# Patient Record
Sex: Female | Born: 2000
Health system: Southern US, Community
[De-identification: ages and names within clinical notes are randomized; demographics above are authoritative.]

## PROBLEM LIST (undated history)

## (undated) DIAGNOSIS — F419 Anxiety disorder, unspecified: Secondary | ICD-10-CM

## (undated) HISTORY — DX: Anxiety disorder, unspecified: F41.9

## (undated) HISTORY — PX: OTHER SURGICAL HISTORY: SHX169

---
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
## 2022-01-12 DIAGNOSIS — F411 Generalized anxiety disorder: Secondary | ICD-10-CM | POA: Diagnosis not present

## 2022-01-27 DIAGNOSIS — F411 Generalized anxiety disorder: Secondary | ICD-10-CM | POA: Diagnosis not present

## 2022-02-12 DIAGNOSIS — F411 Generalized anxiety disorder: Secondary | ICD-10-CM | POA: Diagnosis not present

## 2022-02-25 DIAGNOSIS — F411 Generalized anxiety disorder: Secondary | ICD-10-CM | POA: Diagnosis not present

## 2022-03-09 ENCOUNTER — Other Ambulatory Visit (HOSPITAL_COMMUNITY): Payer: Self-pay

## 2022-03-09 DIAGNOSIS — F419 Anxiety disorder, unspecified: Secondary | ICD-10-CM | POA: Diagnosis not present

## 2022-03-09 MED ORDER — SERTRALINE HCL 50 MG PO TABS
75.0000 mg | ORAL_TABLET | Freq: Every day | ORAL | 0 refills | Status: DC
  Filled 2022-03-09 (×2): qty 135, 90d supply, fill #0

## 2022-03-11 DIAGNOSIS — F411 Generalized anxiety disorder: Secondary | ICD-10-CM | POA: Diagnosis not present

## 2022-03-15 DIAGNOSIS — R071 Chest pain on breathing: Secondary | ICD-10-CM | POA: Diagnosis not present

## 2022-03-15 DIAGNOSIS — F419 Anxiety disorder, unspecified: Secondary | ICD-10-CM | POA: Diagnosis not present

## 2022-03-23 DIAGNOSIS — R599 Enlarged lymph nodes, unspecified: Secondary | ICD-10-CM | POA: Diagnosis not present

## 2022-03-25 DIAGNOSIS — F411 Generalized anxiety disorder: Secondary | ICD-10-CM | POA: Diagnosis not present

## 2022-03-29 DIAGNOSIS — R599 Enlarged lymph nodes, unspecified: Secondary | ICD-10-CM | POA: Diagnosis not present

## 2022-03-29 DIAGNOSIS — J029 Acute pharyngitis, unspecified: Secondary | ICD-10-CM | POA: Diagnosis not present

## 2022-03-29 DIAGNOSIS — Z6822 Body mass index (BMI) 22.0-22.9, adult: Secondary | ICD-10-CM | POA: Diagnosis not present

## 2022-03-29 DIAGNOSIS — B2790 Infectious mononucleosis, unspecified without complication: Secondary | ICD-10-CM | POA: Diagnosis not present

## 2022-03-29 DIAGNOSIS — Z03818 Encounter for observation for suspected exposure to other biological agents ruled out: Secondary | ICD-10-CM | POA: Diagnosis not present

## 2022-03-30 DIAGNOSIS — D225 Melanocytic nevi of trunk: Secondary | ICD-10-CM | POA: Diagnosis not present

## 2022-03-30 DIAGNOSIS — D485 Neoplasm of uncertain behavior of skin: Secondary | ICD-10-CM | POA: Diagnosis not present

## 2022-04-02 ENCOUNTER — Other Ambulatory Visit (HOSPITAL_COMMUNITY): Payer: Self-pay

## 2022-04-02 DIAGNOSIS — B2790 Infectious mononucleosis, unspecified without complication: Secondary | ICD-10-CM | POA: Diagnosis not present

## 2022-04-02 MED ORDER — LIDOCAINE VISCOUS HCL 2 % MT SOLN
OROMUCOSAL | 0 refills | Status: DC
  Filled 2022-04-02: qty 100, 2d supply, fill #0

## 2022-04-02 MED ORDER — PREDNISONE 20 MG PO TABS
40.0000 mg | ORAL_TABLET | Freq: Every day | ORAL | 0 refills | Status: DC
  Filled 2022-04-02: qty 10, 5d supply, fill #0

## 2022-05-11 DIAGNOSIS — F411 Generalized anxiety disorder: Secondary | ICD-10-CM | POA: Diagnosis not present

## 2022-05-11 DIAGNOSIS — R42 Dizziness and giddiness: Secondary | ICD-10-CM | POA: Diagnosis not present

## 2022-05-11 DIAGNOSIS — H6503 Acute serous otitis media, bilateral: Secondary | ICD-10-CM | POA: Diagnosis not present

## 2022-05-11 DIAGNOSIS — J069 Acute upper respiratory infection, unspecified: Secondary | ICD-10-CM | POA: Diagnosis not present

## 2022-05-13 DIAGNOSIS — R42 Dizziness and giddiness: Secondary | ICD-10-CM | POA: Diagnosis not present

## 2022-05-13 DIAGNOSIS — R519 Headache, unspecified: Secondary | ICD-10-CM | POA: Diagnosis not present

## 2022-06-17 ENCOUNTER — Other Ambulatory Visit (HOSPITAL_COMMUNITY): Payer: Self-pay

## 2022-06-17 MED ORDER — SERTRALINE HCL 50 MG PO TABS
75.0000 mg | ORAL_TABLET | Freq: Every day | ORAL | 0 refills | Status: DC
  Filled 2022-06-17: qty 135, 90d supply, fill #0

## 2022-06-18 ENCOUNTER — Other Ambulatory Visit (HOSPITAL_COMMUNITY): Payer: Self-pay

## 2022-06-22 DIAGNOSIS — F411 Generalized anxiety disorder: Secondary | ICD-10-CM | POA: Diagnosis not present

## 2022-06-30 DIAGNOSIS — J309 Allergic rhinitis, unspecified: Secondary | ICD-10-CM | POA: Diagnosis not present

## 2022-07-28 DIAGNOSIS — Z8659 Personal history of other mental and behavioral disorders: Secondary | ICD-10-CM | POA: Diagnosis not present

## 2022-07-28 DIAGNOSIS — R0981 Nasal congestion: Secondary | ICD-10-CM | POA: Diagnosis not present

## 2022-07-28 DIAGNOSIS — Z6825 Body mass index (BMI) 25.0-25.9, adult: Secondary | ICD-10-CM | POA: Diagnosis not present

## 2022-07-28 DIAGNOSIS — R635 Abnormal weight gain: Secondary | ICD-10-CM | POA: Diagnosis not present

## 2022-07-28 DIAGNOSIS — Z3041 Encounter for surveillance of contraceptive pills: Secondary | ICD-10-CM | POA: Diagnosis not present

## 2022-07-28 DIAGNOSIS — J3081 Allergic rhinitis due to animal (cat) (dog) hair and dander: Secondary | ICD-10-CM | POA: Diagnosis not present

## 2022-07-30 ENCOUNTER — Other Ambulatory Visit (HOSPITAL_COMMUNITY): Payer: Self-pay

## 2022-07-30 MED ORDER — AZITHROMYCIN 250 MG PO TABS
ORAL_TABLET | ORAL | 0 refills | Status: DC
  Filled 2022-07-30: qty 6, 5d supply, fill #0

## 2022-08-02 DIAGNOSIS — F411 Generalized anxiety disorder: Secondary | ICD-10-CM | POA: Diagnosis not present

## 2022-09-20 DIAGNOSIS — F411 Generalized anxiety disorder: Secondary | ICD-10-CM | POA: Diagnosis not present

## 2022-10-04 DIAGNOSIS — F411 Generalized anxiety disorder: Secondary | ICD-10-CM | POA: Diagnosis not present

## 2022-10-11 ENCOUNTER — Other Ambulatory Visit (HOSPITAL_COMMUNITY): Payer: Self-pay

## 2022-10-12 ENCOUNTER — Other Ambulatory Visit (HOSPITAL_COMMUNITY): Payer: Self-pay

## 2022-10-12 MED ORDER — SERTRALINE HCL 50 MG PO TABS
75.0000 mg | ORAL_TABLET | Freq: Every day | ORAL | 0 refills | Status: DC
  Filled 2022-10-12: qty 135, 90d supply, fill #0

## 2022-10-19 DIAGNOSIS — Z23 Encounter for immunization: Secondary | ICD-10-CM | POA: Diagnosis not present

## 2022-12-10 ENCOUNTER — Other Ambulatory Visit (HOSPITAL_COMMUNITY): Payer: Self-pay

## 2022-12-10 DIAGNOSIS — R635 Abnormal weight gain: Secondary | ICD-10-CM | POA: Diagnosis not present

## 2022-12-10 DIAGNOSIS — E663 Overweight: Secondary | ICD-10-CM | POA: Diagnosis not present

## 2022-12-10 DIAGNOSIS — Z713 Dietary counseling and surveillance: Secondary | ICD-10-CM | POA: Diagnosis not present

## 2022-12-10 DIAGNOSIS — Z6827 Body mass index (BMI) 27.0-27.9, adult: Secondary | ICD-10-CM | POA: Diagnosis not present

## 2022-12-10 MED ORDER — CONTRAVE 8-90 MG PO TB12
ORAL_TABLET | ORAL | 0 refills | Status: DC
  Filled 2022-12-10: qty 70, 30d supply, fill #0

## 2022-12-24 ENCOUNTER — Other Ambulatory Visit (HOSPITAL_COMMUNITY): Payer: Self-pay

## 2022-12-25 ENCOUNTER — Other Ambulatory Visit (HOSPITAL_COMMUNITY): Payer: Self-pay

## 2023-03-03 DIAGNOSIS — F339 Major depressive disorder, recurrent, unspecified: Secondary | ICD-10-CM | POA: Diagnosis not present

## 2023-03-03 DIAGNOSIS — F411 Generalized anxiety disorder: Secondary | ICD-10-CM | POA: Diagnosis not present

## 2023-03-16 DIAGNOSIS — F411 Generalized anxiety disorder: Secondary | ICD-10-CM | POA: Diagnosis not present

## 2023-04-04 ENCOUNTER — Other Ambulatory Visit (HOSPITAL_COMMUNITY): Payer: Self-pay

## 2023-04-04 ENCOUNTER — Other Ambulatory Visit: Payer: Self-pay

## 2023-04-04 MED ORDER — SERTRALINE HCL 50 MG PO TABS
75.0000 mg | ORAL_TABLET | Freq: Every day | ORAL | 0 refills | Status: DC
  Filled 2023-04-04: qty 135, 90d supply, fill #0

## 2023-04-18 ENCOUNTER — Telehealth: Admitting: Family Medicine

## 2023-04-18 DIAGNOSIS — L03011 Cellulitis of right finger: Secondary | ICD-10-CM | POA: Diagnosis not present

## 2023-04-18 MED ORDER — MUPIROCIN 2 % EX OINT: 1.0000 | TOPICAL_OINTMENT | Freq: Two times a day (BID) | CUTANEOUS | 1 refills | Status: DC

## 2023-04-18 NOTE — Patient Instructions (Signed)
 Nicole Mcclain, thank you for joining Nicole Finner, NP for today's virtual visit.  While this provider is not your primary care provider (PCP), if your PCP is located in our provider database this encounter information will be shared with them immediately following your visit.   A [facility] MyChart account gives you access to today's visit and all your visits, tests, and labs performed at [facility] " click here if you don't have a [facility] MyChart account or go to [email or web address]  Consent: (Patient) Nicole Mcclain provided verbal consent for this virtual visit at the beginning of the encounter.  Current Medications:  Current Outpatient Medications:    mupirocin ointment (BACTROBAN) 2 %, Apply 1 Application topically 2 (two) times daily., Disp: 22 g, Rfl: 1   azithromycin (ZITHROMAX) 250 MG tablet, Take 2 tablets by mouth on the first day then take 1 tablet by mouth daily for 4 days as directed, Disp: 6 tablet, Rfl: 0   busPIRone (BUSPAR) 5 MG tablet, Take 1-2 tablets (5-10 mg total) by mouth 2 (two) times daily., Disp: 120 tablet, Rfl: 2   busPIRone (BUSPAR) 5 MG tablet, , Disp: , Rfl:    FLUoxetine (PROZAC) 10 MG tablet, TAKE 1 TABLET BY MOUTH ONCE A DAY, Disp: 30 tablet, Rfl: 2   FLUoxetine (PROZAC) 10 MG tablet, TAKE 1 TABLET BY MOUTH EVERY MORNING, Disp: 30 tablet, Rfl: 0   FLUoxetine (PROZAC) 10 MG tablet, TAKE 1 TABLET BY MOUTH EVERY MORNING, Disp: 30 tablet, Rfl: 0   FLUoxetine (PROZAC) 10 MG tablet, TAKE 1 TABLET BY MOUTH EVERY MORNING, Disp: 30 tablet, Rfl: 0   FLUoxetine (PROZAC) 10 MG tablet, Take 1 tablet by mouth once a day, Disp: 30 tablet, Rfl: 0   FLUoxetine (PROZAC) 10 MG tablet, Take 1 tablet by mouth once a day., Disp: 90 tablet, Rfl: 0   FLUoxetine (PROZAC) 10 MG tablet, Take 1 tablet by mouth once a day, Disp: 90 tablet, Rfl: 0   HYDROcodone-acetaminophen (NORCO) 7.5-325 MG tablet, Take 1/2 to 1 tablet by mouth every 4 to 6 hours as needed  for severe pain, Disp: 6 tablet, Rfl: 0   ketorolac (TORADOL) 10 MG tablet, Take 1 tablet by mouth 4 times a day, start six hours after IV injection. IV injection will be given in office prior to surgery., Disp: 16 tablet, Rfl: 0   lansoprazole (PREVACID) 15 MG capsule, , Disp: , Rfl:    lidocaine (XYLOCAINE) 2 % solution, Gargle and swallow 10 mls every 4 hours as needed for 7 days, Disp: 100 mL, Rfl: 0   minocycline (MINOCIN) 100 MG capsule, Take by mouth daily., Disp: , Rfl:    Naltrexone-buPROPion HCl ER (CONTRAVE) 8-90 MG TB12, 1 tab in am x 7 days, the 1 tab twice a day x 7 days, then 2 tabs in am and 1 tab in pm x 7 days, then 2 tabs twice a day Orally, Disp: 70 tablet, Rfl: 0   norgestimate-ethinyl estradiol (ORTHO-CYCLEN) 0.25-35 MG-MCG tablet, TAKE 1 TABLET BY MOUTH ONCE A DAY, Disp: 28 tablet, Rfl: 2   Norgestimate-Ethinyl Estradiol Triphasic (TRI-LO-SPRINTEC) 0.18/0.215/0.25 MG-25 MCG tab, Take 1 tablet by mouth once a day, Disp: 84 tablet, Rfl: 3   Norgestimate-Ethinyl Estradiol Triphasic 0.18/0.215/0.25 MG-25 MCG tab, TAKE 1 TABLET BY MOUTH DAILY, Disp: 28 tablet, Rfl: 5   predniSONE (DELTASONE) 20 MG tablet, Take 2 tablets (40 mg total) by mouth daily for 5 days, Disp: 10 tablet, Rfl: 0   sertraline (  ZOLOFT) 50 MG tablet, Take 1 tablet (50 mg total) by mouth daily., Disp: 90 tablet, Rfl: 0   sertraline (ZOLOFT) 50 MG tablet, Take 1.5 tablets (75 mg total) by mouth daily., Disp: 135 tablet, Rfl: 0   sertraline (ZOLOFT) 50 MG tablet, Take 1.5 tablets (75 mg total) by mouth daily., Disp: 135 tablet, Rfl: 0   sertraline (ZOLOFT) 50 MG tablet, Take 1.5 tablets (75 mg total) by mouth daily., Disp: 135 tablet, Rfl: 0   TRI-LO-SPRINTEC 0.18/0.215/0.25 MG-25 MCG tab, , Disp: , Rfl:    Medications ordered in this encounter:  Meds ordered this encounter  Medications   mupirocin ointment (BACTROBAN) 2 %    Sig: Apply 1 Application topically 2 (two) times daily.    Dispense:  22 g    Refill:   1    Supervising Provider:   Merrilee Mcclain [id number]     *If you need refills on other medications prior to your next appointment, please contact your pharmacy*  Follow-Up: Call back or seek an in-person evaluation if the symptoms worsen or if the condition fails to improve as anticipated.  [facility] [phone]  Other Instructions   -staph coverage, no need for oral at this time -keep area clean -only use Band-Aid when working or will get exposed to germs -apply twice daily as directed -if it is not healing then please follow up in person -try to avoid picking nails     If you have been instructed to have an in-person evaluation today at a local Urgent Care facility, please use the link below. It will take you to a list of all of our available [facility], including address, phone number and hours of operation. Please do not delay care.  [facility]  If you or a family member do not have a primary care provider, use the link below to schedule a visit and establish care. When you choose a [facility] primary care physician or advanced practice provider, you gain a long-term partner in health. Find a Primary Care Provider  Learn more about [facility]'s in-office and virtual care options: [facility] - Get Care Now

## 2023-04-18 NOTE — Progress Notes (Signed)
 Virtual Visit Consent   Nicole Mcclain, you are scheduled for a virtual visit with a [facility] provider today. Just as with appointments in the office, your consent must be obtained to participate. Your consent will be active for this visit and any virtual visit you may have with one of our providers in the next 365 days. If you have a MyChart account, a copy of this consent can be sent to you electronically.  As this is a virtual visit, video technology does not allow for your provider to perform a traditional examination. This may limit your provider's ability to fully assess your condition. If your provider identifies any concerns that need to be evaluated in person or the need to arrange testing (such as labs, EKG, etc.), we will make arrangements to do so. Although advances in technology are sophisticated, we cannot ensure that it will always work on either your end or our end. If the connection with a video visit is poor, the visit may have to be switched to a telephone visit. With either a video or telephone visit, we are not always able to ensure that we have a secure connection.  By engaging in this virtual visit, you consent to the provision of healthcare and authorize for your insurance to be billed (if applicable) for the services provided during this visit. Depending on your insurance coverage, you may receive a charge related to this service.  I need to obtain your verbal consent now. Are you willing to proceed with your visit today? Nicole Mcclain has provided verbal consent on 04/18/2023 for a virtual visit (video or telephone). Nicole Finner, NP  Date: 04/18/2023 9:04 AM   Virtual Visit via Video Note   I, Nicole Mcclain, connected with  Nicole Mcclain  ([id number], 2000-08-19) on 04/18/23 at  9:00 AM EDT by a video-enabled telemedicine application and verified that I am speaking with the correct person using two identifiers.  Location: Patient: Virtual Visit Location Patient:  Home Provider: Virtual Visit Location Provider: Home Office   I discussed the limitations of evaluation and management by telemedicine and the availability of in person appointments. The patient expressed understanding and agreed to proceed.    History of Present Illness: Nicole Mcclain is a 23 y.o. who identifies as a female who was assigned female at birth, and is being seen today for finger/nail pain  Onset was  3-4 days ago- with sore nail. Right middle finger.   Associated symptoms are redness warmth, tenderness, and some swelling around the nail bed, cuticle to lateral side. And some redness and swelling is moving some down the finger towards the first joint. Feels like the entire finger is swollen.  Modifying factors are keeping it clean and neosporin  Denies drainage, fevers, chills, trauma or injury.  Does pick at nails.   Problems:  Patient Active Problem List   Diagnosis Date Noted   ADHD (attention deficit hyperactivity disorder), inattentive type 09/20/2018   Generalized anxiety disorder 09/20/2018   ADHD (attention deficit hyperactivity disorder) evaluation 08/29/2018   Attention and concentration deficit 08/17/2018   Anxiety-like symptoms 08/17/2018   Academic/educational problem 08/17/2018   Goals of care, counseling/discussion 08/17/2018    Allergies: No Known Allergies Medications:  Current Outpatient Medications:    azithromycin (ZITHROMAX) 250 MG tablet, Take 2 tablets by mouth on the first day then take 1 tablet by mouth daily for 4 days as directed, Disp: 6 tablet, Rfl: 0   busPIRone (BUSPAR) 5  MG tablet, Take 1-2 tablets (5-10 mg total) by mouth 2 (two) times daily., Disp: 120 tablet, Rfl: 2   busPIRone (BUSPAR) 5 MG tablet, , Disp: , Rfl:    FLUoxetine (PROZAC) 10 MG tablet, TAKE 1 TABLET BY MOUTH ONCE A DAY, Disp: 30 tablet, Rfl: 2   FLUoxetine (PROZAC) 10 MG tablet, TAKE 1 TABLET BY MOUTH EVERY MORNING, Disp: 30 tablet, Rfl: 0   FLUoxetine (PROZAC) 10 MG  tablet, TAKE 1 TABLET BY MOUTH EVERY MORNING, Disp: 30 tablet, Rfl: 0   FLUoxetine (PROZAC) 10 MG tablet, TAKE 1 TABLET BY MOUTH EVERY MORNING, Disp: 30 tablet, Rfl: 0   FLUoxetine (PROZAC) 10 MG tablet, Take 1 tablet by mouth once a day, Disp: 30 tablet, Rfl: 0   FLUoxetine (PROZAC) 10 MG tablet, Take 1 tablet by mouth once a day., Disp: 90 tablet, Rfl: 0   FLUoxetine (PROZAC) 10 MG tablet, Take 1 tablet by mouth once a day, Disp: 90 tablet, Rfl: 0   HYDROcodone-acetaminophen (NORCO) 7.5-325 MG tablet, Take 1/2 to 1 tablet by mouth every 4 to 6 hours as needed for severe pain, Disp: 6 tablet, Rfl: 0   ketorolac (TORADOL) 10 MG tablet, Take 1 tablet by mouth 4 times a day, start six hours after IV injection. IV injection will be given in office prior to surgery., Disp: 16 tablet, Rfl: 0   lansoprazole (PREVACID) 15 MG capsule, , Disp: , Rfl:    lidocaine (XYLOCAINE) 2 % solution, Gargle and swallow 10 mls every 4 hours as needed for 7 days, Disp: 100 mL, Rfl: 0   minocycline (MINOCIN) 100 MG capsule, Take by mouth daily., Disp: , Rfl:    Naltrexone-buPROPion HCl ER (CONTRAVE) 8-90 MG TB12, 1 tab in am x 7 days, the 1 tab twice a day x 7 days, then 2 tabs in am and 1 tab in pm x 7 days, then 2 tabs twice a day Orally, Disp: 70 tablet, Rfl: 0   norgestimate-ethinyl estradiol (ORTHO-CYCLEN) 0.25-35 MG-MCG tablet, TAKE 1 TABLET BY MOUTH ONCE A DAY, Disp: 28 tablet, Rfl: 2   Norgestimate-Ethinyl Estradiol Triphasic (TRI-LO-SPRINTEC) 0.18/0.215/0.25 MG-25 MCG tab, Take 1 tablet by mouth once a day, Disp: 84 tablet, Rfl: 3   Norgestimate-Ethinyl Estradiol Triphasic 0.18/0.215/0.25 MG-25 MCG tab, TAKE 1 TABLET BY MOUTH DAILY, Disp: 28 tablet, Rfl: 5   predniSONE (DELTASONE) 20 MG tablet, Take 2 tablets (40 mg total) by mouth daily for 5 days, Disp: 10 tablet, Rfl: 0   sertraline (ZOLOFT) 50 MG tablet, Take 1 tablet (50 mg total) by mouth daily., Disp: 90 tablet, Rfl: 0   sertraline (ZOLOFT) 50 MG tablet,  Take 1.5 tablets (75 mg total) by mouth daily., Disp: 135 tablet, Rfl: 0   sertraline (ZOLOFT) 50 MG tablet, Take 1.5 tablets (75 mg total) by mouth daily., Disp: 135 tablet, Rfl: 0   sertraline (ZOLOFT) 50 MG tablet, Take 1.5 tablets (75 mg total) by mouth daily., Disp: 135 tablet, Rfl: 0   TRI-LO-SPRINTEC 0.18/0.215/0.25 MG-25 MCG tab, , Disp: , Rfl:   Observations/Objective: Patient is well-developed, well-nourished in no acute distress.  Resting comfortably  at home.  Head is normocephalic, atraumatic.  No labored breathing.  Speech is clear and coherent with logical content.  Patient is alert and oriented at baseline.  Right middle finger- appears red and inflamed along nail bed.   Assessment and Plan:  1. Infection of nail bed of finger of right hand (Primary)  - mupirocin ointment (BACTROBAN) 2 %; Apply  1 Application topically 2 (two) times daily.  Dispense: 22 g; Refill: 1  -staph coverage, no need for oral at this time -keep area clean -only use Band-Aid when working or will get exposed to germs -apply twice daily as directed -if it is not healing then please follow up in person -try to avoid picking nails   Reviewed side effects, risks and benefits of medication.    Patient acknowledged agreement and understanding of the plan.   Past Medical, Surgical, Social History, Allergies, and Medications have been Reviewed.    Follow Up Instructions: I discussed the assessment and treatment plan with the patient. The patient was provided an opportunity to ask questions and all were answered. The patient agreed with the plan and demonstrated an understanding of the instructions.  A copy of instructions were sent to the patient via MyChart unless otherwise noted below.    The patient was advised to call back or seek an in-person evaluation if the symptoms worsen or if the condition fails to improve as anticipated.    Nicole Finner, NP

## 2023-04-25 DIAGNOSIS — F411 Generalized anxiety disorder: Secondary | ICD-10-CM | POA: Diagnosis not present

## 2023-07-04 DIAGNOSIS — F411 Generalized anxiety disorder: Secondary | ICD-10-CM | POA: Diagnosis not present

## 2023-07-04 DIAGNOSIS — F331 Major depressive disorder, recurrent, moderate: Secondary | ICD-10-CM | POA: Diagnosis not present

## 2023-07-06 ENCOUNTER — Other Ambulatory Visit: Payer: Self-pay

## 2023-07-06 ENCOUNTER — Other Ambulatory Visit (HOSPITAL_COMMUNITY): Payer: Self-pay

## 2023-07-06 ENCOUNTER — Encounter: Payer: Self-pay | Admitting: Pharmacist

## 2023-07-06 DIAGNOSIS — F411 Generalized anxiety disorder: Secondary | ICD-10-CM | POA: Diagnosis not present

## 2023-07-06 DIAGNOSIS — Z6825 Body mass index (BMI) 25.0-25.9, adult: Secondary | ICD-10-CM | POA: Diagnosis not present

## 2023-07-06 DIAGNOSIS — R599 Enlarged lymph nodes, unspecified: Secondary | ICD-10-CM | POA: Diagnosis not present

## 2023-07-06 DIAGNOSIS — R002 Palpitations: Secondary | ICD-10-CM | POA: Diagnosis not present

## 2023-07-06 DIAGNOSIS — Z1389 Encounter for screening for other disorder: Secondary | ICD-10-CM | POA: Diagnosis not present

## 2023-07-06 MED ORDER — ESCITALOPRAM OXALATE 10 MG PO TABS
10.0000 mg | ORAL_TABLET | Freq: Every day | ORAL | 0 refills | Status: DC
  Filled 2023-07-06 (×2): qty 30, 30d supply, fill #0
  Filled 2023-07-06: qty 30, 15d supply, fill #0

## 2023-07-06 MED ORDER — PROPRANOLOL HCL 10 MG PO TABS
10.0000 mg | ORAL_TABLET | ORAL | 1 refills | Status: DC
  Filled 2023-07-06 (×3): qty 30, 15d supply, fill #0

## 2023-07-06 MED ORDER — NORGESTIMATE-ETH ESTRADIOL 0.18/0.215/0.25 MG-25 MCG PO TABS
1.0000 | ORAL_TABLET | Freq: Every day | ORAL | 0 refills | Status: DC
  Filled 2023-07-06: qty 90, 90d supply, fill #0
  Filled 2023-07-06 – 2023-07-16 (×2): qty 84, 84d supply, fill #0
  Filled 2023-09-29 – 2023-10-03 (×2): qty 84, 84d supply, fill #1

## 2023-07-16 ENCOUNTER — Other Ambulatory Visit (HOSPITAL_COMMUNITY): Payer: Self-pay

## 2023-07-25 DIAGNOSIS — F411 Generalized anxiety disorder: Secondary | ICD-10-CM | POA: Diagnosis not present

## 2023-07-25 DIAGNOSIS — F331 Major depressive disorder, recurrent, moderate: Secondary | ICD-10-CM | POA: Diagnosis not present

## 2023-08-01 DIAGNOSIS — F331 Major depressive disorder, recurrent, moderate: Secondary | ICD-10-CM | POA: Diagnosis not present

## 2023-08-01 DIAGNOSIS — F411 Generalized anxiety disorder: Secondary | ICD-10-CM | POA: Diagnosis not present

## 2023-08-03 ENCOUNTER — Other Ambulatory Visit: Payer: Self-pay

## 2023-08-03 ENCOUNTER — Other Ambulatory Visit (HOSPITAL_COMMUNITY): Payer: Self-pay

## 2023-08-03 DIAGNOSIS — F411 Generalized anxiety disorder: Secondary | ICD-10-CM | POA: Diagnosis not present

## 2023-08-03 DIAGNOSIS — Z6826 Body mass index (BMI) 26.0-26.9, adult: Secondary | ICD-10-CM | POA: Diagnosis not present

## 2023-08-03 DIAGNOSIS — R002 Palpitations: Secondary | ICD-10-CM | POA: Diagnosis not present

## 2023-08-03 DIAGNOSIS — E611 Iron deficiency: Secondary | ICD-10-CM | POA: Diagnosis not present

## 2023-08-03 MED ORDER — PROPRANOLOL HCL 10 MG PO TABS
10.0000 mg | ORAL_TABLET | Freq: Two times a day (BID) | ORAL | 1 refills | Status: DC | PRN
  Filled 2023-08-03: qty 30, 15d supply, fill #0
  Filled 2023-09-29: qty 30, 15d supply, fill #1

## 2023-08-03 MED ORDER — ESCITALOPRAM OXALATE 10 MG PO TABS
10.0000 mg | ORAL_TABLET | Freq: Every day | ORAL | 0 refills | Status: DC
  Filled 2023-08-03: qty 90, 90d supply, fill #0

## 2023-08-12 DIAGNOSIS — F331 Major depressive disorder, recurrent, moderate: Secondary | ICD-10-CM | POA: Diagnosis not present

## 2023-08-12 DIAGNOSIS — F411 Generalized anxiety disorder: Secondary | ICD-10-CM | POA: Diagnosis not present

## 2023-08-23 DIAGNOSIS — E611 Iron deficiency: Secondary | ICD-10-CM | POA: Diagnosis not present

## 2023-08-23 DIAGNOSIS — D72829 Elevated white blood cell count, unspecified: Secondary | ICD-10-CM | POA: Diagnosis not present

## 2023-08-23 DIAGNOSIS — R002 Palpitations: Secondary | ICD-10-CM | POA: Diagnosis not present

## 2023-08-23 DIAGNOSIS — F411 Generalized anxiety disorder: Secondary | ICD-10-CM | POA: Diagnosis not present

## 2023-08-23 DIAGNOSIS — Z6825 Body mass index (BMI) 25.0-25.9, adult: Secondary | ICD-10-CM | POA: Diagnosis not present

## 2023-08-24 DIAGNOSIS — F411 Generalized anxiety disorder: Secondary | ICD-10-CM | POA: Diagnosis not present

## 2023-08-24 DIAGNOSIS — F331 Major depressive disorder, recurrent, moderate: Secondary | ICD-10-CM | POA: Diagnosis not present

## 2023-08-29 ENCOUNTER — Ambulatory Visit: Attending: Cardiology | Admitting: Cardiology

## 2023-08-29 ENCOUNTER — Encounter: Payer: Self-pay | Admitting: Cardiology

## 2023-08-29 ENCOUNTER — Ambulatory Visit

## 2023-08-29 VITALS — BP 92/60 | HR 73 | Ht 67.0 in | Wt 168.0 lb

## 2023-08-29 DIAGNOSIS — R002 Palpitations: Secondary | ICD-10-CM | POA: Diagnosis not present

## 2023-08-29 DIAGNOSIS — F411 Generalized anxiety disorder: Secondary | ICD-10-CM

## 2023-08-29 NOTE — Progress Notes (Unsigned)
 Enrolled for [employer] to mail a ZIO XT long term holter monitor to the patients address on file.

## 2023-08-29 NOTE — Progress Notes (Unsigned)
 Cardiology Office Note:  .   Date:  09/01/2023  ID:  Nicole Mcclain, DOB 03/23/2000, MRN [id number] PCP: Sybil Hoose, MD  [facility] Providers Cardiologist:  Alm Clay, MD     Chief Complaint  Patient presents with   New Patient (Initial Visit)    Palpitations/tachycardia    Patient Profile: .     Nicole Mcclain is a essentially healthy 22 y.o. female with a PMH notable for chronic anxiety/depression (now on Lexapro ) who presents here for evaluation of rapid heart rates at the request of Sybil Hoose, MD.    Nicole Mcclain was last by Charmaine Bright, PA at [facility] physicians on 08/23/2023 with multiple complaints 1 of which was episodes of tachycardia associate with dizziness fatigue and worsening anxiety.  These episodes are that followed by feeling tired.  No syncope or near STEMI associated with it.  Just feeling fatigued.  Thyroid  levels were normal.  She was given prescription for as needed propranolol .  Referred to cardiology.  Subjective  Discussed the use of AI scribe software for clinical note transcription with the patient, who gave verbal consent to proceed.  History of Present Illness  Nicole Mcclain is a 23 year old female who presents with episodes of tachycardia and associated symptoms.  She has been experiencing episodes of elevated heart rate since early June, with her heart rate reaching up to 140 bpm with minimal activity, such as walking down the stairs. She describes sensations of her heart 'pumping hard' and sometimes feels off balance, though not lightheaded. These symptoms are sometimes triggered by changes in position and are exacerbated by heat and physical activity. She also reports feeling exhausted and lacking energy, which has not improved with her current medication regimen.  Her medication history includes a recent switch from Zoloft  to Lexapro  for anxiety, which did not alleviate her symptoms. She is also taking propranolol  10 mg as needed,  usually once a day in the afternoon, but feels it does not significantly impact her symptoms. She denies any pass out spells but notes that anxiety exacerbates her symptoms. She has a history of anxiety but states that these cardiac symptoms are new and not experienced during her high school years.  Her family history includes a brother and aunt with heart murmurs and a grandfather with heart disease, heart attacks, and a pacemaker. She denies any swelling in her legs and reports drinking a significant amount of water daily, including using liquid IV for hydration. She has been taking an iron supplement and reports having heavy periods, though she does not associate them with worsening palpitations. Her thyroid  levels were checked as part of her recent workup.  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 She reports minimal alcohol consumption and no regular exercise.     Objective   Family History - Brother: heart murmur - Aunt: heart murmur - Grandfather: heart disease, heart attacks, other heart conditions, pacemaker - No family history of rhythm issues in parents.  Current Meds:  NorGestim triphasic daily sertraline  50 mg - 1.5 tab daily => converted to Lexapro  10 mg 1.5 tab daily; propranolol  10 mg PRN  Studies Reviewed: SABRA   EKG Interpretation Date/Time:  Monday August 29 2023 14:25:09 EDT Ventricular Rate:  73 PR Interval:  156 QRS Duration:  90 QT Interval:  382 QTC Calculation: 420 R Axis:   69  Text Interpretation: Normal sinus rhythm Normal ECG No previous ECGs available Confirmed by Clay,  Alm ([id number]) on 09/01/2023 9:58:18 PM    Review of EKG from PCP: Sinus rhythm with borderline RSR prime.  Computer read as probable septal infarct, but that is highly unlikely.  Labs from [facility] 07/06/2023: Sodium 138, potassium 4.5, chloride 103, bicarb 25, BUN 10, Cr 0.71, Ca 9.7, AST 50, ALT 15, ALP 88.  Hgb 13.0  No studies  Risk  Assessment/Calculations:           Physical Exam:   VS:  BP 92/60   Pulse 73   Ht 5' 7 (1.702 m)   Wt 168 lb (76.2 kg)   SpO2 97%   BMI 26.31 kg/m    Wt Readings from Last 3 Encounters:  08/29/23 168 lb (76.2 kg)  08/28/20 125 lb (56.7 kg) (50%, Z= 0.00)*  12/29/16 113 lb (51.3 kg) (49%, Z= -0.03)*   * Growth percentiles are based on CDC (Girls, 2-20 Years) data.    GEN: Well nourished, well developed in no acute distress; healthy-appearing.  Well-groomed. NECK: No JVD; No carotid bruits CARDIAC: Normal S1, S2; RRR, no murmurs, rubs, gallops RESPIRATORY:  Clear to auscultation without rales, wheezing or rhonchi ; nonlabored, good air movement. ABDOMEN: Soft, non-tender, non-distended EXTREMITIES:  No edema; No deformity      ASSESSMENT AND PLAN: .    Problem List Items Addressed This Visit     Generalized anxiety disorder (Chronic)   Anxiety exacerbates tachycardia. Managed with Lexapro  after sertraline , with no significant symptom change. Anxiety may contribute to autonomic imbalance and tachycardia. - Continue anxiety management with Lexapro . - Discuss impact of anxiety on heart rate and strategies for anxiety management, including relaxation techniques.      Rapid palpitations - Primary   Dysautonomia with tachycardia and orthostatic intolerance Episodes of tachycardia up to 140 bpm with orthostatic intolerance and fatigue, exacerbated by heat and activity. Differential includes inappropriate sinus tachycardia and dysautonomia. Labs normal except slightly low iron. Suspected autonomic imbalance, possibly improving with age. - Order Holter monitor to assess heart rhythm and rate. - Advise liberalizing salt intake and maintaining hydration with fluids like Liquid IV. - Recommend core strengthening and isometric exercises to improve orthostatic tolerance. - Discuss non-pharmacological maneuvers for tachycardia management, such as drinking ice cold water, applying ice to  the chest, and performing vagal maneuvers. - Discussed core exercises and recumbent exercises. - Consider switching to a longer-acting beta-blocker if propranolol  is ineffective.      Relevant Orders   EKG 12-Lead (Completed)   LONG TERM MONITOR (3-14 DAYS)            Follow-Up: Return in about 2 months (around 10/29/2023) for Followup with Telemedicine, To discuss test results.     Signed, Alm MICAEL Clay, MD, MS Alm Clay, M.D., M.S. Interventional Chartered certified accountant  Pager # [phone]

## 2023-08-29 NOTE — Patient Instructions (Signed)
 Other Instructions   Recommendations for vagal maneuvers: Bearing down Coughing Gagging Icy, cold towel on face or drink ice cold water    Try recumbent exercising   Use liberal salt on your food.     Medication Instructions:   No changes  *If you need a refill on your cardiac medications before your next appointment, please call your pharmacy*   Lab Work: Not needed .   Testing/Procedures:    Follow-Up: At [facility], you and your health needs are our priority.  As part of our continuing mission to provide you with exceptional heart care, we have created designated Provider Care Teams.  These Care Teams include your primary Cardiologist (physician) and Advanced Practice Providers (APPs -  Physician Assistants and Nurse Practitioners) who all work together to provide you with the care you need, when you need it.     Your next appointment:   2 month(s)  The format for your next appointment:   In Person  Provider:   Alm Clay, MD   Other Instructions   Recommendations for vagal maneuvers: Bearing down Coughing Gagging Icy, cold towel on face or drink ice cold water    Try recumbent exercising   Use liberal salt on your food.     ZIO XT- Long Term Monitor Instructions  Your physician has requested you wear a ZIO patch monitor for 7 days.  This is a single patch monitor. [employer] supplies one patch monitor per enrollment. Additional stickers are not available. Please do not apply patch if you will be having a Nuclear Stress Test,  Echocardiogram, Cardiac CT, MRI, or Chest Xray during the period you would be wearing the  monitor. The patch cannot be worn during these tests. You cannot remove and re-apply the  ZIO XT patch monitor.  Your ZIO patch monitor will be mailed 3 day USPS to your address on file. It may take 3-5 days  to receive your monitor after you have been enrolled.  Once you have received your monitor, please review the  enclosed instructions. Your monitor  has already been registered assigning a specific monitor serial # to you.  Billing and Patient Assistance Program Information  We have supplied [employer] with any of your insurance information on file for billing purposes. [employer] offers a sliding scale Patient Assistance Program for patients that do not have  insurance, or whose insurance does not completely cover the cost of the ZIO monitor.  You must apply for the Patient Assistance Program to qualify for this discounted rate.  To apply, please call [employer] at [phone], select option 4, select option 2, ask to apply for  Patient Assistance Program. Meredeth will ask your household income, and how many people  are in your household. They will quote your out-of-pocket cost based on that information.  [employer] will also be able to set up a 45-month, interest-free payment plan if needed.  Applying the monitor   Shave hair from upper left chest.  Hold abrader disc by orange tab. Rub abrader in 40 strokes over the upper left chest as  indicated in your monitor instructions.  Clean area with 4 enclosed alcohol pads. Let dry.  Apply patch as indicated in monitor instructions. Patch will be placed under collarbone on left  side of chest with arrow pointing upward.  Rub patch adhesive wings for 2 minutes. Remove white label marked 1. Remove the white  label marked 2. Rub patch adhesive wings for 2 additional minutes.  While looking in a  mirror, press and release button in center of patch. A small green light will  flash 3-4 times. This will be your only indicator that the monitor has been turned on.  Do not shower for the first 24 hours. You may shower after the first 24 hours.  Press the button if you feel a symptom. You will hear a small click. Record Date, Time and  Symptom in the Patient Logbook.  When you are ready to remove the patch, follow instructions on the last 2 pages of Patient  Logbook.  Stick patch monitor onto the last page of Patient Logbook.  Place Patient Logbook in the blue and white box. Use locking tab on box and tape box closed  securely. The blue and white box has prepaid postage on it. Please place it in the mailbox as  soon as possible. Your physician should have your test results approximately 7 days after the  monitor has been mailed back to [facility].  Call [facility] Customer Care at [phone] if you have questions regarding  your ZIO XT patch monitor. Call them immediately if you see an orange light blinking on your  monitor.  If your monitor falls off in less than 4 days, contact our Monitor department at [phone].  If your monitor becomes loose or falls off after 4 days call [employer] at [phone] for  suggestions on securing your monitor

## 2023-09-01 ENCOUNTER — Encounter: Payer: Self-pay | Admitting: Cardiology

## 2023-09-01 DIAGNOSIS — R002 Palpitations: Secondary | ICD-10-CM | POA: Insufficient documentation

## 2023-09-01 NOTE — Assessment & Plan Note (Addendum)
 Dysautonomia with tachycardia and orthostatic intolerance Episodes of tachycardia up to 140 bpm with orthostatic intolerance and fatigue, exacerbated by heat and activity. Differential includes inappropriate sinus tachycardia and dysautonomia. Labs normal except slightly low iron. Suspected autonomic imbalance, possibly improving with age. - Order Holter monitor to assess heart rhythm and rate. - Advise liberalizing salt intake and maintaining hydration with fluids like Liquid IV. - Recommend core strengthening and isometric exercises to improve orthostatic tolerance. - Discuss non-pharmacological maneuvers for tachycardia management, such as drinking ice cold water, applying ice to the chest, and performing vagal maneuvers. - Discussed core exercises and recumbent exercises. - Consider switching to a longer-acting beta-blocker if propranolol  is ineffective.

## 2023-09-01 NOTE — Assessment & Plan Note (Signed)
 Anxiety exacerbates tachycardia. Managed with Lexapro  after sertraline , with no significant symptom change. Anxiety may contribute to autonomic imbalance and tachycardia. - Continue anxiety management with Lexapro . - Discuss impact of anxiety on heart rate and strategies for anxiety management, including relaxation techniques.

## 2023-09-02 DIAGNOSIS — F411 Generalized anxiety disorder: Secondary | ICD-10-CM | POA: Diagnosis not present

## 2023-09-02 DIAGNOSIS — F331 Major depressive disorder, recurrent, moderate: Secondary | ICD-10-CM | POA: Diagnosis not present

## 2023-09-15 DIAGNOSIS — F331 Major depressive disorder, recurrent, moderate: Secondary | ICD-10-CM | POA: Diagnosis not present

## 2023-09-15 DIAGNOSIS — F411 Generalized anxiety disorder: Secondary | ICD-10-CM | POA: Diagnosis not present

## 2023-09-16 DIAGNOSIS — R002 Palpitations: Secondary | ICD-10-CM | POA: Diagnosis not present

## 2023-09-29 ENCOUNTER — Other Ambulatory Visit: Payer: Self-pay

## 2023-09-29 ENCOUNTER — Other Ambulatory Visit (HOSPITAL_COMMUNITY): Payer: Self-pay

## 2023-09-29 MED ORDER — ESCITALOPRAM OXALATE 10 MG PO TABS
15.0000 mg | ORAL_TABLET | Freq: Every day | ORAL | 0 refills | Status: DC
  Filled 2023-09-29 – 2023-10-03 (×2): qty 135, 90d supply, fill #0

## 2023-10-03 ENCOUNTER — Other Ambulatory Visit (HOSPITAL_COMMUNITY): Payer: Self-pay

## 2023-10-03 ENCOUNTER — Other Ambulatory Visit: Payer: Self-pay

## 2023-10-03 MED ORDER — NORGESTIMATE-ETH ESTRADIOL 0.18/0.215/0.25 MG-25 MCG PO TABS
1.0000 | ORAL_TABLET | Freq: Every day | ORAL | 0 refills | Status: DC
  Filled 2023-10-03: qty 84, 84d supply, fill #0

## 2023-10-04 ENCOUNTER — Other Ambulatory Visit (HOSPITAL_COMMUNITY): Payer: Self-pay

## 2023-10-04 ENCOUNTER — Other Ambulatory Visit: Payer: Self-pay

## 2023-10-04 MED ORDER — NORGESTIMATE-ETH ESTRADIOL 0.18/0.215/0.25 MG-25 MCG PO TABS
1.0000 | ORAL_TABLET | Freq: Every day | ORAL | 0 refills | Status: AC
  Filled 2023-10-04 – 2023-12-26 (×2): qty 84, 84d supply, fill #0

## 2023-10-04 MED ORDER — PROPRANOLOL HCL 10 MG PO TABS
10.0000 mg | ORAL_TABLET | Freq: Two times a day (BID) | ORAL | 0 refills | Status: AC | PRN
  Filled 2023-10-04: qty 90, 45d supply, fill #0

## 2023-10-04 MED ORDER — ESCITALOPRAM OXALATE 10 MG PO TABS
15.0000 mg | ORAL_TABLET | Freq: Every day | ORAL | 0 refills | Status: DC
  Filled 2023-10-04: qty 135, 90d supply, fill #0

## 2023-10-17 ENCOUNTER — Ambulatory Visit: Attending: Cardiology | Admitting: Cardiology

## 2023-10-17 ENCOUNTER — Encounter: Payer: Self-pay | Admitting: Cardiology

## 2023-10-17 ENCOUNTER — Telehealth: Payer: Self-pay | Admitting: *Deleted

## 2023-10-17 VITALS — HR 90 | Ht 67.0 in | Wt 168.0 lb

## 2023-10-17 DIAGNOSIS — F411 Generalized anxiety disorder: Secondary | ICD-10-CM

## 2023-10-17 DIAGNOSIS — R002 Palpitations: Secondary | ICD-10-CM | POA: Diagnosis not present

## 2023-10-17 NOTE — Progress Notes (Deleted)
  Cardiology Office Note:  .   Date:  10/17/2023  ID:  Wash DELENA Pesa, DOB [birth year], MRN [id number] PCP: Sybil Hoose, MD  [facility] Providers Cardiologist:  Alm Clay, MD { Click to update primary MD,subspecialty MD or APP then REFRESH:1}    No chief complaint on file.   Patient Profile: .     AYLSSA HERRIG is a *** 23 y.o. female *** with a PMH notable for *** who presents here for *** at the request of Sybil Hoose, MD.  Wash DELENA Pesa is a essentially healthy 23 y.o. female with a PMH notable for chronic anxiety/depression (now on Lexapro ) who presents here for evaluation of rapid heart rates at the request of Sybil Hoose, MD.       Wash DELENA Pesa was last seen on ***  Subjective  Discussed the use of AI scribe software for clinical note transcription with the patient, who gave verbal consent to proceed.  History of Present Illness      Cardiovascular ROS: {roscv:310661}  ROS:  Review of Systems - {ros master:310782}    Objective    Studies Reviewed: .        Results  ECHO: *** CATH: *** MONITOR: *** CT: ***  Risk Assessment/Calculations:   {Does this patient have ATRIAL FIBRILLATION?:[id number]} No BP recorded.  {Refresh Note OR Click here to enter BP  :1}***         Physical Exam:   VS:  There were no vitals taken for this visit.   Wt Readings from Last 3 Encounters:  08/29/23 168 lb (76.2 kg)  08/28/20 125 lb (56.7 kg) (50%, Z= 0.00)*  12/29/16 113 lb (51.3 kg) (49%, Z= -0.03)*   * Growth percentiles are based on CDC (Girls, 2-20 Years) data.    Physical Exam    GEN: Well nourished, well developed in no acute distress; *** NECK: No JVD; No carotid bruits CARDIAC: Normal S1, S2; RRR, no murmurs, rubs, gallops RESPIRATORY:  Clear to auscultation without rales, wheezing or rhonchi ; nonlabored, good air movement. ABDOMEN: Soft, non-tender, non-distended EXTREMITIES:  No edema; No deformity      ASSESSMENT AND PLAN: .     Problem List Items Addressed This Visit   None   Assessment and Plan Assessment & Plan        {Are you ordering a CV Procedure (e.g. stress test, cath, DCCV, TEE, etc)?   Press F2        :[id number]}   Follow-Up: No follow-ups on file.  I spent *** minutes in the care of Lashona A Thiem today including {CHL AMB CAR Time Based Billing Options STW (Optional):[phone]::documenting in the encounter.}      Signed, Alm MICAEL Clay, MD, MS Alm Clay, M.D., M.S. Interventional Cardiologist  [facility] Pager # [phone]

## 2023-10-17 NOTE — Progress Notes (Signed)
 Virtual Visit via Video Note   Because of Nicole Mcclain's co-morbid illnesses, she is at least at moderate risk for complications without adequate follow up.  This format is felt to be most appropriate for this patient at this time.  All issues noted in this document were discussed and addressed.  A limited physical exam was performed with this format.  Please refer to the patient's chart for her consent to telehealth for [facility].      Patient has given verbal permission to conduct this visit via virtual appointment and to bill insurance 10/17/2023 2:06 PM     Evaluation Performed:  Follow-up visit  Date:  10/17/2023   ID:  Nicole, Mcclain 02/15/2000, MRN [id number]  Patient Location: Other:  [employer] dorm  Provider Location: Home Office  PCP:  Nicole Hoose, MD  Cardiologist:  Nicole Clay, MD  Electrophysiologist:  None   Chief Complaint:   Chief Complaint  Patient presents with   Follow-up   Palpitations    To discuss test results    ====================================  ASSESSMENT & PLAN:    Problem List Items Addressed This Visit     Generalized anxiety disorder (Chronic)   I do suspect that she is feeling tachycardia partly related to anxiety.  Seems to be better controlled now that she is on Lexapro .  She also has PRN propranolol .      Rapid palpitations - Primary (Chronic)   She clearly probably has some component of dysautonomia/inappropriate sinus tachycardia with sinus tachycardia with no arrhythmias noted on monitor. No arrhythmias noted on monitor. - Advised to liberalize salt intake and increase level of hydration.  Using liquid IV and other hydration supplements - Suggested that her heart rate responsiveness will probably improve the more she is conditioned. -Discussed recumbent exercises and core strengthening exercises. The hope would be to avoid longstanding beta-blocker, but okay to use as needed tramadol for breakthrough  spells.  Provided information about POTS recommendation-although I do not think this is truly POTS       ====================================  History of Present Illness:    Nicole Mcclain is a 23 y.o. female with PMH notable for anxiety and palpitations who presents via audio/video conferencing for a telehealth visit today as a 6-week follow-up to discuss Zio patch results.  Nicole Mcclain was seen on August 29, 2023 for initial consultation.  She described having episodes of elevated heart rate that began earlier that year in June.  Describing episodes where her heart rate going to the 140s even to 170s with activity.  She would notice her heart rate going to the 140s with simply walking up or down steps.  She feels her heart pumping hard and sometimes feels little off balance but no lightheadedness or near syncope.  Often times these episodes are triggered by change in position and are exacerbated by heat or physical activity.  That makes her feel tired and exhausted.  She had recently switched from Zoloft  to Lexapro  but still have anxiety.  She had PRN propranolol  10 mg that was she was using maybe once a day.  Hospitalizations:  None   Recent - Interim CV studies:   The following studies were reviewed today: 7-day Zio patch: Predominantly sinus rhythm with a rate range of 50 to 149 bpm and an average of 82 bpm.  Rare PACs and PVCs with no couplets or triplets.  Orest Elders block noted during sleeping hours with rates dropping to 36 bpm while  in winky block.  Otherwise no sustained pauses or bradycardia.  No sustained tachyarrhythmias.  Inerval History   Nicole Mcclain has come back to [employer] where she is starting her senior year.  She says that since I last saw her she has been doing quite well and has not been noticing her heart rate racing nearly as much.  She does think that when she feels that it is usually associated with anxiety but she does have some  positional nature to it.  She still notes that her heart rate can get up into the 150s to 170s with exertion (playing pickle ball but it does not last for a long time.  There is no irregularity to it.    Cardiovascular ROS: no chest pain or dyspnea on exertion positive for - irregular heartbeat and rapid heart rate negative for - edema, orthopnea, paroxysmal nocturnal dyspnea, shortness of breath, or syncope or near syncope, TIA or amaurosis fugax, claudication   ROS:  Please see the history of present illness.    Otherwise negative x 8 systems   Past Medical History:  Diagnosis Date   Anxiety    Past Surgical History:  Procedure Laterality Date   None       Current Meds  Medication Sig   escitalopram  (LEXAPRO ) 10 MG tablet Take 1 tablet (10 mg total) by mouth daily. (Patient taking differently: Take 15 mg by mouth daily. Patient taking 1.5  tablet daily)   Norgestimate -Eth Estradiol  (TRI-VYLIBRA  LO) 0.18/0.215/0.25 MG-25 MCG TABS Take 1 tablet by mouth daily.   propranolol  (INDERAL ) 10 MG tablet Take 1 tablet (10 mg total) by mouth every 12 (twelve) hours as needed.   [DISCONTINUED] escitalopram  (LEXAPRO ) 10 MG tablet Take 1.5 tablets (15 mg total) by mouth daily.   [DISCONTINUED] escitalopram  (LEXAPRO ) 10 MG tablet Take 15 mg by mouth daily.     Allergies:   Patient has no known allergies.   Social History   Tobacco Use   Smoking status: Every Day    Types: E-cigarettes   Smokeless tobacco: Never  Vaping Use   Vaping status: Never Used  Substance Use Topics   Alcohol use: No   Drug use: No     Labs/Other Tests and Data Reviewed:    7-day Zio patch monitor (August 2025): Sinus rhythm with a rate range of 50-149 bpm and average of 82 bpm.  Very rare isolated PACs and PVCs noted with at least 1 blocked PAC and Wenke Bach block noted during sleep (4:30 AM).  No short or sustained arrhythmias or bradycardia.  EKG:  No ECG reviewed.  Recent Labs: No results found for  requested labs within last 365 days.   Recent Lipid Panel No results found for: CHOL, TRIG, HDL, CHOLHDL, LDLCALC, LDLDIRECT  Wt Readings from Last 3 Encounters:  10/17/23 168 lb (76.2 kg)  08/29/23 168 lb (76.2 kg)  08/28/20 125 lb (56.7 kg) (50%, Z= 0.00)*   * Growth percentiles are based on CDC (Girls, 2-20 Years) data.     Objective:    Vital Signs:  Pulse 90   Ht 5' 7 (1.702 m)   Wt 168 lb (76.2 kg)   BMI 26.31 kg/m   VITAL SIGNS:  reviewed GEN:  no acute distress RESPIRATORY:  normal respiratory effort, symmetric expansion NEURO:  alert and oriented x 3, no obvious focal deficit  ==========================================  COVID-19 Education: The signs and symptoms of COVID-19 were discussed with the patient and how to seek care  for testing (follow up with PCP or arrange E-visit).   The importance of social distancing was discussed today.  Time:   Today, I have spent 10 minutes minutes with the patient with telehealth technology discussing the above problems.   An additional 5 minutes minutes spent charting (reviewing prior notes, hospital records, studies, labs etc.) Total 15 minutes   Medication Adjustments/Labs and Tests Ordered: Current medicines are reviewed at length with the patient today.  Concerns regarding medicines are outlined above.   Patient Instructions  Medication Instructions:  Okay to use the propranolol  as needed either 1/2 to 1 tablet as needed for episodes where you feel your heart rate is going very fast.  I just would recommend that you do not do it for the first time when you have something important coming up.  It may be that if you do well with the trial run, that it would be is of the can use as needed for times that would usually make you anxious *If you need a refill on your cardiac medications before your next appointment, please call your pharmacy*  Lab Work: None If you have labs (blood work) drawn today and your tests  are completely normal, you will receive your results only by: MyChart Message (if you have MyChart) OR A paper copy in the mail If you have any lab test that is abnormal or we need to change your treatment, we will call you to review the results.  Testing/Procedures: None  Follow-Up: At [facility], you and your health needs are our priority.  As part of our continuing mission to provide you with exceptional heart care, our providers are all part of one team.  This team includes your primary Cardiologist (physician) and Advanced Practice Providers or APPs (Physician Assistants and Nurse Practitioners) who all work together to provide you with the care you need, when you need it.  Your next appointment:   As needed 1 to 3years  Provider:   Alm Clay, MD    We recommend signing up for the patient portal called MyChart.  Sign up information is provided on this After Visit Summary.  MyChart is used to connect with patients for Virtual Visits (Telemedicine).  Patients are able to view lab/test results, encounter notes, upcoming appointments, etc.  Non-urgent messages can be sent to your provider as well.   To learn more about what you can do with MyChart, go to [email or web address].   Other Instructions   Postural Orthostatic Tachycardia Syndrome: Spent >20 minutes counseling specifically on the etiology, diagnosis, and symptom management of POTS. The following recommendations were emphasized:  -avoid dehydration. Often it requires high volumes of fluids, often with salt/electrolytes included, to stay hydrated. People with POTS are very sensitive to fluid shifts and dehydration. Oral rehydration is preferred, and routine use of IV fluids is not recommended.  -if tolerated, compression stocking can assist with fluid management and prevent pooling in the legs.  -slow position changes are recommended  -if there is a feeling of severe lightheadedness, like near to passing out,  recommend lying on the floor on the back, with legs elevated up on a chair or up against the wall.  -the best long term management of POTS symptoms is gradual exercise conditioning. I recommend seated exercises such as bike to start, to avoid the risk of falling with lightheadedness. Exercise programs, either through supervised programs like cardiac rehab or through personal programs, should focus on gradually increasing exercise tolerance and conditioning.   -  this is a link to specific exercise recommendations for POTS:   http://peterson-powell.net/  -we discussed the typical spectrum of dysautonomia, including typical populations, that this sometimes spontaneously improves with age (though a small percentage have persistent symptoms), that this has uncomfortable symptoms but is not associated with long term mortality, and that the etiology/treatment of this is an area of active research    Nicole Clay, MD          Signed, Nicole Clay, MD  10/17/2023 2:06 PM    [facility]

## 2023-10-17 NOTE — Patient Instructions (Signed)
 Medication Instructions:  Genna to use the propranolol  as needed either 1/2 to 1 tablet as needed for episodes where you feel your heart rate is going very fast.  I just would recommend that you do not do it for the first time when you have something important coming up.  It may be that if you do well with the trial run, that it would be is of the can use as needed for times that would usually make you anxious *If you need a refill on your cardiac medications before your next appointment, please call your pharmacy*  Lab Work: None If you have labs (blood work) drawn today and your tests are completely normal, you will receive your results only by: MyChart Message (if you have MyChart) OR A paper copy in the mail If you have any lab test that is abnormal or we need to change your treatment, we will call you to review the results.  Testing/Procedures: None  Follow-Up: At [facility], you and your health needs are our priority.  As part of our continuing mission to provide you with exceptional heart care, our providers are all part of one team.  This team includes your primary Cardiologist (physician) and Advanced Practice Providers or APPs (Physician Assistants and Nurse Practitioners) who all work together to provide you with the care you need, when you need it.  Your next appointment:   As needed 1 to 3years  Provider:   Alm Clay, MD    We recommend signing up for the patient portal called MyChart.  Sign up information is provided on this After Visit Summary.  MyChart is used to connect with patients for Virtual Visits (Telemedicine).  Patients are able to view lab/test results, encounter notes, upcoming appointments, etc.  Non-urgent messages can be sent to your provider as well.   To learn more about what you can do with MyChart, go to [email or web address].   Other Instructions   Postural Orthostatic Tachycardia Syndrome: Spent >20 minutes counseling specifically on  the etiology, diagnosis, and symptom management of POTS. The following recommendations were emphasized:  -avoid dehydration. Often it requires high volumes of fluids, often with salt/electrolytes included, to stay hydrated. People with POTS are very sensitive to fluid shifts and dehydration. Oral rehydration is preferred, and routine use of IV fluids is not recommended.  -if tolerated, compression stocking can assist with fluid management and prevent pooling in the legs.  -slow position changes are recommended  -if there is a feeling of severe lightheadedness, like near to passing out, recommend lying on the floor on the back, with legs elevated up on a chair or up against the wall.  -the best long term management of POTS symptoms is gradual exercise conditioning. I recommend seated exercises such as bike to start, to avoid the risk of falling with lightheadedness. Exercise programs, either through supervised programs like cardiac rehab or through personal programs, should focus on gradually increasing exercise tolerance and conditioning.   -this is a link to specific exercise recommendations for POTS:   http://peterson-powell.net/  -we discussed the typical spectrum of dysautonomia, including typical populations, that this sometimes spontaneously improves with age (though a small percentage have persistent symptoms), that this has uncomfortable symptoms but is not associated with long term mortality, and that the etiology/treatment of this is an area of active research    Alm Clay, MD

## 2023-10-17 NOTE — Telephone Encounter (Signed)
 No answer - went voice mail

## 2023-10-17 NOTE — Assessment & Plan Note (Signed)
 I do suspect that she is feeling tachycardia partly related to anxiety.  Seems to be better controlled now that she is on Lexapro .  She also has PRN propranolol .

## 2023-10-17 NOTE — Assessment & Plan Note (Addendum)
 She clearly probably has some component of dysautonomia/inappropriate sinus tachycardia with sinus tachycardia with no arrhythmias noted on monitor. No arrhythmias noted on monitor. - Advised to liberalize salt intake and increase level of hydration.  Using liquid IV and other hydration supplements - Suggested that her heart rate responsiveness will probably improve the more she is conditioned. -Discussed recumbent exercises and core strengthening exercises. The hope would be to avoid longstanding beta-blocker, but okay to use as needed tramadol for breakthrough spells.  Provided information about POTS recommendation-although I do not think this is truly POTS

## 2023-10-17 NOTE — Telephone Encounter (Signed)
  Patient Consent for Virtual Visit        Nicole Mcclain has provided verbal consent on 10/17/2023 for a virtual visit (video or telephone).   CONSENT FOR VIRTUAL VISIT FOR:  Nicole Mcclain  By participating in this virtual visit I agree to the following:  I hereby voluntarily request, consent and authorize [facility] and its employed or contracted physicians, physician assistants, nurse practitioners or other licensed health care professionals (the Practitioner), to provide me with telemedicine health care services (the "Services) as deemed necessary by the treating Practitioner. I acknowledge and consent to receive the Services by the Practitioner via telemedicine. I understand that the telemedicine visit will involve communicating with the Practitioner through live audiovisual communication technology and the disclosure of certain medical information by electronic transmission. I acknowledge that I have been given the opportunity to request an in-person assessment or other available alternative prior to the telemedicine visit and am voluntarily participating in the telemedicine visit.  I understand that I have the right to withhold or withdraw my consent to the use of telemedicine in the course of my care at any time, without affecting my right to future care or treatment, and that the Practitioner or I may terminate the telemedicine visit at any time. I understand that I have the right to inspect all information obtained and/or recorded in the course of the telemedicine visit and may receive copies of available information for a reasonable fee.  I understand that some of the potential risks of receiving the Services via telemedicine include:  Delay or interruption in medical evaluation due to technological equipment failure or disruption; Information transmitted may not be sufficient (e.g. poor resolution of images) to allow for appropriate medical decision making by the  Practitioner; and/or  In rare instances, security protocols could fail, causing a breach of personal health information.  Furthermore, I acknowledge that it is my responsibility to provide information about my medical history, conditions and care that is complete and accurate to the best of my ability. I acknowledge that Practitioner's advice, recommendations, and/or decision may be based on factors not within their control, such as incomplete or inaccurate data provided by me or distortions of diagnostic images or specimens that may result from electronic transmissions. I understand that the practice of medicine is not an exact science and that Practitioner makes no warranties or guarantees regarding treatment outcomes. I acknowledge that a copy of this consent can be made available to me via my patient portal [facility] MyChart), or I can request a printed copy by calling the office of [facility].    I understand that my insurance will be billed for this visit.   I have read or had this consent read to me. I understand the contents of this consent, which adequately explains the benefits and risks of the Services being provided via telemedicine.  I have been provided ample opportunity to ask questions regarding this consent and the Services and have had my questions answered to my satisfaction. I give my informed consent for the services to be provided through the use of telemedicine in my medical care

## 2023-11-04 ENCOUNTER — Other Ambulatory Visit (HOSPITAL_COMMUNITY): Payer: Self-pay

## 2023-11-04 ENCOUNTER — Other Ambulatory Visit (HOSPITAL_BASED_OUTPATIENT_CLINIC_OR_DEPARTMENT_OTHER): Payer: Self-pay

## 2023-11-04 DIAGNOSIS — F411 Generalized anxiety disorder: Secondary | ICD-10-CM | POA: Diagnosis not present

## 2023-11-04 MED ORDER — ESCITALOPRAM OXALATE 10 MG PO TABS
15.0000 mg | ORAL_TABLET | Freq: Every day | ORAL | 0 refills | Status: AC
  Filled 2023-11-04 – 2023-12-26 (×2): qty 135, 90d supply, fill #0

## 2023-12-26 ENCOUNTER — Other Ambulatory Visit (HOSPITAL_COMMUNITY): Payer: Self-pay

## 2023-12-26 ENCOUNTER — Other Ambulatory Visit: Payer: Self-pay

## 2024-01-08 ENCOUNTER — Ambulatory Visit: Payer: Self-pay | Admitting: Cardiology

## 2024-01-08 DIAGNOSIS — R002 Palpitations: Secondary | ICD-10-CM

## 2024-01-30 ENCOUNTER — Other Ambulatory Visit (HOSPITAL_COMMUNITY): Payer: Self-pay

## 2024-01-30 MED ORDER — BUPROPION HCL ER (XL) 150 MG PO TB24
150.0000 mg | ORAL_TABLET | Freq: Every day | ORAL | 0 refills | Status: AC
  Filled 2024-01-30: qty 90, 90d supply, fill #0
